# Patient Record
Sex: Female | Born: 1975 | Race: White | Hispanic: No | Marital: Married | Smoking: Former smoker
Health system: Southern US, Community
[De-identification: ages and names within clinical notes are randomized; demographics above are authoritative.]

## PROBLEM LIST (undated history)

## (undated) DIAGNOSIS — R112 Nausea with vomiting, unspecified: Secondary | ICD-10-CM

## (undated) DIAGNOSIS — Z9889 Other specified postprocedural states: Secondary | ICD-10-CM

## (undated) DIAGNOSIS — Z789 Other specified health status: Secondary | ICD-10-CM

---

## 2010-08-17 HISTORY — PX: CHOLECYSTECTOMY: SHX55

## 2011-02-14 ENCOUNTER — Inpatient Hospital Stay (HOSPITAL_COMMUNITY): Payer: Managed Care, Other (non HMO)

## 2011-02-14 ENCOUNTER — Inpatient Hospital Stay (HOSPITAL_COMMUNITY)
Admission: AD | Admit: 2011-02-14 | Discharge: 2011-02-14 | Disposition: A | Discharge status: Home / Self Care | Payer: Managed Care, Other (non HMO) | Source: Ambulatory Visit | Attending: Obstetrics and Gynecology | Admitting: Obstetrics and Gynecology

## 2011-02-14 DIAGNOSIS — R109 Unspecified abdominal pain: Secondary | ICD-10-CM

## 2011-02-14 DIAGNOSIS — N940 Mittelschmerz: Secondary | ICD-10-CM | POA: Insufficient documentation

## 2011-02-14 DIAGNOSIS — N949 Unspecified condition associated with female genital organs and menstrual cycle: Secondary | ICD-10-CM

## 2011-02-14 LAB — URINALYSIS, ROUTINE W REFLEX MICROSCOPIC
Bilirubin Urine: NEGATIVE
Glucose, UA: NEGATIVE mg/dL
Leukocytes, UA: NEGATIVE
Nitrite: NEGATIVE
Specific Gravity, Urine: 1.015 (ref 1.005–1.030)
pH: 5.5 (ref 5.0–8.0)

## 2011-02-14 LAB — POCT PREGNANCY, URINE: Preg Test, Ur: NEGATIVE

## 2013-08-17 HISTORY — PX: HERNIA REPAIR: SHX51

## 2014-03-25 ENCOUNTER — Observation Stay: Payer: Self-pay | Admitting: Surgery

## 2014-03-25 LAB — BASIC METABOLIC PANEL
ANION GAP: 8 (ref 7–16)
BUN: 7 mg/dL (ref 7–18)
CALCIUM: 8.7 mg/dL (ref 8.5–10.1)
CHLORIDE: 107 mmol/L (ref 98–107)
CREATININE: 0.87 mg/dL (ref 0.60–1.30)
Co2: 26 mmol/L (ref 21–32)
EGFR (African American): >60
GLUCOSE: 106 mg/dL — AB (ref 65–99)
OSMOLALITY: 280 (ref 275–301)
POTASSIUM: 3.7 mmol/L (ref 3.5–5.1)
Sodium: 141 mmol/L (ref 136–145)

## 2014-03-25 LAB — CBC
HCT: 41.8 % (ref 35.0–47.0)
HGB: 14.2 g/dL (ref 12.0–16.0)
MCH: 30.7 pg (ref 26.0–34.0)
MCHC: 33.9 g/dL (ref 32.0–36.0)
MCV: 91 fL (ref 80–100)
Platelet: 249 10*3/uL (ref 150–440)
RBC: 4.62 10*6/uL (ref 3.80–5.20)
RDW: 13 % (ref 11.5–14.5)
WBC: 10.8 10*3/uL (ref 3.6–11.0)

## 2014-03-25 LAB — HEPATIC FUNCTION PANEL A (ARMC)
Albumin: 3.3 g/dL — ABNORMAL LOW (ref 3.4–5.0)
Alkaline Phosphatase: 66 U/L
BILIRUBIN TOTAL: 0.4 mg/dL (ref 0.2–1.0)
Bilirubin, Direct: <0.1 mg/dL (ref 0.00–0.20)
SGOT(AST): 16 U/L (ref 15–37)
SGPT (ALT): 23 U/L
Total Protein: 7.4 g/dL (ref 6.4–8.2)

## 2014-03-25 LAB — PREGNANCY, URINE: Pregnancy Test, Urine: NEGATIVE m[IU]/mL

## 2014-03-25 LAB — PRO B NATRIURETIC PEPTIDE: B-Type Natriuretic Peptide: 153 pg/mL — ABNORMAL HIGH (ref 0–125)

## 2014-03-25 LAB — D-DIMER(ARMC): D-Dimer: 739 ng/ml

## 2014-03-25 LAB — TROPONIN I: Troponin-I: <0.02 ng/mL

## 2014-03-26 LAB — CBC WITH DIFFERENTIAL/PLATELET
BASOS PCT: 0.3 %
Basophil #: 0 10*3/uL (ref 0.0–0.1)
EOS PCT: 0.1 %
Eosinophil #: 0 10*3/uL (ref 0.0–0.7)
HCT: 35.2 % (ref 35.0–47.0)
HGB: 12 g/dL (ref 12.0–16.0)
LYMPHS PCT: 5.8 %
Lymphocyte #: 0.9 10*3/uL — ABNORMAL LOW (ref 1.0–3.6)
MCH: 30.6 pg (ref 26.0–34.0)
MCHC: 34 g/dL (ref 32.0–36.0)
MCV: 90 fL (ref 80–100)
MONO ABS: 0.8 x10 3/mm (ref 0.2–0.9)
Monocyte %: 5.4 %
NEUTROS PCT: 88.4 %
Neutrophil #: 13.6 10*3/uL — ABNORMAL HIGH (ref 1.4–6.5)
Platelet: 234 10*3/uL (ref 150–440)
RBC: 3.91 10*6/uL (ref 3.80–5.20)
RDW: 12.9 % (ref 11.5–14.5)
WBC: 15.4 10*3/uL — ABNORMAL HIGH (ref 3.6–11.0)

## 2014-03-26 LAB — COMPREHENSIVE METABOLIC PANEL
ALBUMIN: 2.6 g/dL — AB (ref 3.4–5.0)
ALT: 36 U/L
AST: 36 U/L (ref 15–37)
Alkaline Phosphatase: 50 U/L
Anion Gap: 8 (ref 7–16)
BILIRUBIN TOTAL: 0.4 mg/dL (ref 0.2–1.0)
BUN: 7 mg/dL (ref 7–18)
CALCIUM: 8 mg/dL — AB (ref 8.5–10.1)
CO2: 26 mmol/L (ref 21–32)
Chloride: 108 mmol/L — ABNORMAL HIGH (ref 98–107)
Creatinine: 0.85 mg/dL (ref 0.60–1.30)
EGFR (African American): >60
EGFR (Non-African Amer.): >60
Glucose: 137 mg/dL — ABNORMAL HIGH (ref 65–99)
OSMOLALITY: 283 (ref 275–301)
Potassium: 4.3 mmol/L (ref 3.5–5.1)
SODIUM: 142 mmol/L (ref 136–145)
Total Protein: 6.3 g/dL — ABNORMAL LOW (ref 6.4–8.2)

## 2014-03-28 LAB — PATHOLOGY REPORT

## 2014-12-08 NOTE — Op Note (Signed)
PATIENT NAME:  Janet Campbell, Janet Campbell MR#:  [id number] DATE OF BIRTH:  1976-05-17  DATE OF PROCEDURE:  03/25/2014  PREOPERATIVE DIAGNOSIS: Acute cholecystitis.   POSTOPERATIVE DIAGNOSIS: Acute gangrenous cholecystitis.  PROCEDURE: Laparoscopic cholecystectomy.   SURGEON: [place] Stell E. Excell Seltzerooper, M.D.   ANESTHESIA: General with endotracheal tube.   INDICATIONS: This is a patient with severe right upper quadrant pain, which has been unrelenting and work-up showing acute cholecystitis. Preoperatively, we discussed rationale for surgery, the options of observation, the risk of bleeding, infection, open procedure, bile duct damage, bile duct leak, retained common bile duct stone, any of which could require further surgery and/or ERCP, stent, and papillotomy. This was all reviewed for her. She understood and agreed to proceed.   FINDINGS: Acute cholecystitis, gangrenous, with extensive vascularization around the vessels, and huge gallstones necessitating enlargement of the upper incision.   DESCRIPTION OF PROCEDURE: The patient was induced to general anesthesia. She was on IV antibiotics. VTE prophylaxis was in place. She was prepped and draped in a sterile fashion. Marcaine was infiltrated in the skin and subcutaneous tissues around the periumbilical area. Incision was made. A Veress needle was placed. The pneumoperitoneum was obtained, and a 5 mm trocar port was placed. Of note, because of her morbid obesity, an extra long Veress needle and an extra long entry port in the 5 mm umbilical site was utilized.   The abdominal cavity was explored, and under direct vision, a 10 mm epigastric port and 2 lateral 5 mm ports were placed. The gallbladder was placed on tension, found to be greatly edematous and probably gangrenous. Dissection around the gallbladder demonstrated multiple engorged vessels. These vessels were divided with clips,   and this allowed for visualization of the cystic duct (Dictation Anomaly) <<as it  entered the>> the infundibulum of the gallbladder. The cystic duct gallbladder junction was well identified, doubly clipped and divided. The cystic artery branches were doubly clipped and divided. Multiple clips were utilized on the lateral side of the gallbladder fossa where extensive bleeding was encountered. The gallbladder was taken from the gallbladder fossa, both with blunt dissection and electrocautery dissection due to the severe edema, and the gallbladder was passed out through an enlarged epigastric port site with the aid of an Endo Catch bag.   The port site was replaced. The area was irrigated with copious amounts of normal saline. There was no sign of bowel injury or bile duct leak; however, there was still considerable bleeding from the gallbladder fossa, which was handled with electrocautery, and then once hemostasis was adequate, through the lateral port site a 10 mm JP drain was placed into the foramen of Winslow, held in with 3-0 nylon. Hemostasis again checked and found to be adequate. The camera was placed in the epigastric site to view back at the periumbilical site. There was no sign of bowel injury or adhesions. Therefore, pneumoperitoneum was released. All ports were removed. Fascial edges at the enlarged epigastric site were closed with multiple figure-of-eight 0 Vicryls and then 4-0 subcuticular Monocryl was used on all skin edges. Steri-Strips, Mastisol and sterile dressings were placed.  A drain was placed to bulb suction and the patient was taken to the recovery room in stable condition to be admitted for continued care.    ____________________________ Adah Salvageichard E. Excell Seltzerooper, MD rec:jr D: 03/25/2014 14:15:19 ET T: 03/25/2014 16:09:48 ET JOB#: [id number]  cc: Adah Salvageichard E. Excell Seltzerooper, MD, <Dictator> Lattie HawICHARD E Shaquira Moroz MD ELECTRONICALLY SIGNED 04/02/2014 19:21

## 2014-12-08 NOTE — H&P (Signed)
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 Assoc w/ nausea, no vomiting. No fever or jaundice Sx, or pain radiation. Only thing that helps is Ibuprofen.   Past History Obesity   ALLERGIES:  No Known Allergies:   Family and Social History:  Family History Non-Contributory   Social History positive  tobacco, negative ETOH, negative Illicit drugs, Single, lives with a roomate (who is OOT and has a dog at home); smokes 1 PPD, cutting down in last 2 weeks to 2 cigarettes per day currently   + Tobacco Prior (greater than 1 year)   Place of Living Home   Review of Systems:  Fever/Chills No   Cough No   Sputum No   Abdominal Pain No   Diarrhea No   Constipation No   Nausea/Vomiting Yes   SOB/DOE No   Chest Pain Yes   Dysuria No   Tolerating PT Yes   Tolerating Diet Yes  Nauseated   Medications/Allergies Reviewed Medications/Allergies reviewed   Physical Exam:  GEN well developed, well nourished, no acute distress, obese   HEENT pink conjunctivae, PERRL, hearing intact to voice, moist oral mucosa, Oropharynx clear, good dentition   NECK supple  trachea midline   RESP normal resp effort  clear BS  no use of accessory muscles   CARD regular rate  no murmur  no thrills  no JVD  no Rub   ABD denies tenderness   EXTR negative edema   SKIN normal to palpation, No rashes, skin turgor good   NEURO cranial nerves intact, negative tremor, follows commands, motor/sensory function intact   PSYCH alert, A+O to time, place, person, good insight   Lab Results: Hepatic:  08-Aug-15 23:33   Bilirubin, Total 0.4  Bilirubin, Direct < 0.1 (Result(s) reported on 25 Mar 2014 at 01:44AM.)  Alkaline Phosphatase 66 (46-116 NOTE: New Reference Range 03/06/14)  SGPT (ALT) 23 (14-63 NOTE: New Reference Range 03/06/14)  SGOT (AST) 16   Total Protein, Serum 7.4  Albumin, Serum  3.3  Routine Chem:  08-Aug-15 23:44   Glucose, Serum  106  BUN 7  Creatinine (comp) 0.87  Sodium, Serum 141  Potassium, Serum 3.7  Chloride, Serum 107  CO2, Serum 26  Calcium (Total), Serum 8.7  Anion Gap 8  Osmolality (calc) 280  eGFR (African American) >60  eGFR (Non-African American) >60 (eGFR values <68mL/min/1.73 m2 may be an indication of chronic kidney disease (CKD). Calculated eGFR is useful in patients with stable renal function. The eGFR calculation will not be reliable in acutely ill patients when serum creatinine is changing rapidly. It is not useful in  patients on dialysis. The eGFR calculation may not be applicable to patients at the low and high extremes of body sizes, pregnant women, and vegetarians.)  B-Type Natriuretic Peptide [facility])  153 (Result(s) reported on 25 Mar 2014 at 12:17AM.)  Cardiac:  08-Aug-15 23:44   Troponin I < 0.02 (0.00-0.05 0.05 ng/mL or less: NEGATIVE  Repeat testing in 3-6 hrs  if clinically indicated. >0.05 ng/mL: POTENTIAL  MYOCARDIAL INJURY. Repeat  testing in 3-6 hrs if  clinically indicated. NOTE: An increase or decrease  of 30% or more on serial  testing suggests a  clinically important change)  Routine Coag:  08-Aug-15 23:33   D-Dimer, Quantitative 739 (INTERPRETATION <> Exclusion of Venous Thromboembolism (VTE) - OUTPATIENT ONLY       (  Emergency Department or [place])             0-499 ng/ml (FEU)  : With a low to intermediate pretest                                  probability for VTE this test result                                  excludes the diagnosis of VTE.             > 499 ng/ml (FEU)  : VTE not excluded; additional work up                                  for VTE is required. <> Testing on Inpatients and Evaluation of Disseminated Intravascular        Coagulation (DIC)             Reference Range:  0-499 ng/ml (FEU))  Routine Hem:  08-Aug-15 23:44   WBC (CBC)  10.8  RBC (CBC) 4.62  Hemoglobin (CBC) 14.2  Hematocrit (CBC) 41.8  Platelet Count (CBC) 249 (Result(s) reported on 25 Mar 2014 at 12:05AM.)  MCV 91  MCH 30.7  MCHC 33.9  RDW 13.0    Assessment/Admission Diagnosis CTPA - 38 x 30 mm gallstone with surrounding edema by my review  Acute cholecystitis   Plan U/S Admit, IVF, IV ABx Lap CCY (pt understands Dr Burt Knack will see her later today and assume her care)   Electronic Signatures: Consuela Mimes (MD)  (Signed 09-Aug-15 05:42)  Authored: CHIEF COMPLAINT and HISTORY, ALLERGIES, FAMILY AND SOCIAL HISTORY, REVIEW OF SYSTEMS, PHYSICAL EXAM, LABS, ASSESSMENT AND PLAN   Last Updated: 09-Aug-15 05:42 by Consuela Mimes (MD)

## 2017-06-28 DIAGNOSIS — H5213 Myopia, bilateral: Secondary | ICD-10-CM | POA: Diagnosis not present

## 2021-04-24 ENCOUNTER — Other Ambulatory Visit: Payer: Self-pay | Admitting: General Surgery

## 2021-04-24 DIAGNOSIS — K432 Incisional hernia without obstruction or gangrene: Secondary | ICD-10-CM

## 2021-04-30 ENCOUNTER — Ambulatory Visit: Payer: Self-pay | Admitting: General Surgery

## 2021-04-30 ENCOUNTER — Ambulatory Visit
Admission: RE | Admit: 2021-04-30 | Discharge: 2021-04-30 | Disposition: A | Discharge status: Home / Self Care | Payer: Commercial Managed Care - PPO | Source: Ambulatory Visit | Attending: General Surgery | Admitting: General Surgery

## 2021-04-30 DIAGNOSIS — K432 Incisional hernia without obstruction or gangrene: Secondary | ICD-10-CM

## 2021-04-30 NOTE — H&P (Signed)
Subjective   Chief Complaint: No chief complaint on file.       History of Present Illness: Janet Campbell is a 45 y.o. female who is seen today for incisional hernia that is recurrent. Patient follows back up status post CT scan.  CT scan does show a 10.5 cm wide hernia and 7 cm long hernia.  Patient states that she is continue with significant discomfort.  Patient does have small bowel within the hernia.  I did review the CT scan personally and with the patient.   Patient does states she smokes approximate pack a day.     Patient does have history of laparoscopic ventral hernia repair with mesh that was in the epigastrium.  This was the area of gallbladder excision in the past.     Review of Systems: A complete review of systems was obtained from the patient.  I have reviewed this information and discussed as appropriate with the patient.  See HPI as well for other ROS.   Review of Systems  Constitutional: Negative for fever.  HENT: Negative for congestion.   Eyes: Negative for blurred vision.  Respiratory: Negative for cough, shortness of breath and wheezing.   Cardiovascular: Negative for chest pain and palpitations.  Gastrointestinal: Negative for heartburn.  Genitourinary: Negative for dysuria.  Musculoskeletal: Negative for myalgias.  Skin: Negative for rash.  Neurological: Negative for dizziness and headaches.  Psychiatric/Behavioral: Negative for depression and suicidal ideas.  All other systems reviewed and are negative.       Medical History: Past Medical History History reviewed. No pertinent past medical history.    There is no problem list on file for this patient.     Past Surgical History Past Surgical History: Procedure Laterality Date  CESAREAN SECTION      HERNIA REPAIR      LAPAROSCOPIC CHOLECYSTECTOMY          Allergies No Known Allergies    No current outpatient medications on file prior to visit.   No current facility-administered  medications on file prior to visit.     Family History Family History Problem Relation Age of Onset  Obesity Mother    High blood pressure (Hypertension) Father    Obesity Father    Obesity Brother        Social History   Tobacco Use Smoking Status Never Smoker Smokeless Tobacco Never Used     Social History Social History    Socioeconomic History  Marital status: Married Tobacco Use  Smoking status: Never Smoker  Smokeless tobacco: Never Used Substance and Sexual Activity  Alcohol use: Never  Drug use: Never      Objective:     There were no vitals filed for this visit.  There is no height or weight on file to calculate BMI.   Physical Exam Constitutional:      Appearance: Normal appearance.  HENT:     Head: Normocephalic and atraumatic.     Mouth/Throat:     Mouth: Mucous membranes are moist.     Pharynx: Oropharynx is clear.  Eyes:     General: No scleral icterus.    Pupils: Pupils are equal, round, and reactive to light.  Cardiovascular:     Rate and Rhythm: Normal rate and regular rhythm.     Pulses: Normal pulses.     Heart sounds: No murmur heard.   No friction rub. No gallop.  Pulmonary:     Effort: Pulmonary effort is normal. No respiratory distress.  Breath sounds: Normal breath sounds. No stridor.  Abdominal:     General: Abdomen is flat.     Hernia: A hernia is present.       Comments: Palp hernia  Musculoskeletal:        General: No swelling.  Skin:    General: Skin is warm.  Neurological:     General: No focal deficit present.     Mental Status: She is alert and oriented to person, place, and time. Mental status is at baseline.  Psychiatric:        Mood and Affect: Mood normal.        Thought Content: Thought content normal.        Judgment: Judgment normal.            Assessment and Plan: Diagnoses and all orders for this visit:   Recurrent incisional hernia with incarceration       45 year old female with a  recurrent incisional hernia 1.  We will proceed to the operating for exploratory laparotomy, lysis adhesions, repair of incisional hernia with mesh. 2.  Discussed with the risk and benefits of the procedure to include but not limited to: Infection, bleeding, damage to surrounding structures, possible need for further surgery.  Patient voiced understanding wished to proceed. 3.  I discussed with her to stop smoking.  Discussed with her that this will likely increase the chance of recurrence in the future.       No follow-ups on file.   Axel Filler, MD

## 2021-06-27 NOTE — Progress Notes (Signed)
Surgical Instructions    Your procedure is scheduled on 07/02/21.  Report to [facility] Main Entrance "A" at 6:30 A.M., then check in with the Admitting office.  Call this number if you have problems the morning of surgery:  [phone]   If you have any questions prior to your surgery date call [phone]: Open Monday-Friday 8am-4pm    Remember:  Do not eat after midnight the night before your surgery  You may drink clear liquids until 5:30 the morning of your surgery.   Clear liquids allowed are: Water, Non-Citrus Juices (without pulp), Carbonated Beverages, Clear Tea, Black Coffee ONLY (NO MILK, CREAM OR POWDERED CREAMER of any kind), and Gatorade    Take these medicines the morning of surgery with A SIP OF WATER: NONE   As of today, STOP taking any Aspirin (unless otherwise instructed by your surgeon) Aleve, Naproxen, Ibuprofen, Motrin, Advil, Goody's, BC's, all herbal medications, fish oil, and all vitamins.     After your COVID test   You are not required to quarantine however you are required to wear a well-fitting mask when you are out and around people not in your household.  If your mask becomes wet or soiled, replace with a new one.  Wash your hands often with soap and water for 20 seconds or clean your hands with an alcohol-based hand sanitizer that contains at least 60% alcohol.  Do not share personal items.  Notify your provider: if you are in close contact with someone who has COVID  or if you develop a fever of 100.4 or greater, sneezing, cough, sore throat, shortness of breath or body aches.             Do not wear jewelry or makeup Do not wear lotions, powders, perfumes or deodorant. Do not shave 48 hours prior to surgery.   Do not bring valuables to the hospital. DO Not wear nail polish, gel polish, artificial nails, or any other type of covering on natural nails including finger and toenails. If patients have artificial nails, gel coating, etc. that  need to be removed by a nail salon, please have this removed prior to surgery or surgery may need to be canceled/delayed if the surgeon/ anesthesia feels like the patient is unable to be adequately monitored.             [facility] is not responsible for any belongings or valuables.  Do NOT Smoke (Tobacco/Vaping)  24 hours prior to your procedure  If you use a CPAP at night, you may bring your mask for your overnight stay.   Contacts, glasses, hearing aids, dentures or partials may not be worn into surgery, please bring cases for these belongings   For patients admitted to the hospital, discharge time will be determined by your treatment team.   Patients discharged the day of surgery will not be allowed to drive home, and someone needs to stay with them for 24 hours.  NO VISITORS WILL BE ALLOWED IN PRE-OP WHERE PATIENTS ARE PREPPED FOR SURGERY.  ONLY 1 SUPPORT PERSON MAY BE PRESENT IN THE WAITING ROOM WHILE YOU ARE IN SURGERY.  IF YOU ARE TO BE ADMITTED, ONCE YOU ARE IN YOUR ROOM YOU WILL BE ALLOWED TWO (2) VISITORS. 1 (ONE) VISITOR MAY STAY OVERNIGHT BUT MUST ARRIVE TO THE ROOM BY 8pm.  Minor children may have two parents present. Special consideration for safety and communication needs will be reviewed on a case by case basis.  Special instructions:  Oral Hygiene is also important to reduce your risk of infection.  Remember - BRUSH YOUR TEETH THE MORNING OF SURGERY WITH YOUR REGULAR TOOTHPASTE   [place]- Preparing For Surgery  Before surgery, you can play an important role. Because skin is not sterile, your skin needs to be as free of germs as possible. You can reduce the number of germs on your skin by washing with CHG (chlorahexidine gluconate) Soap before surgery.  CHG is an antiseptic cleaner which kills germs and bonds with the skin to continue killing germs even after washing.     Please do not use if you have an allergy to CHG or antibacterial soaps. If your skin becomes  reddened/irritated stop using the CHG.  Do not shave (including legs and underarms) for at least 48 hours prior to first CHG shower. It is OK to shave your face.  Please follow these instructions carefully.     Shower the NIGHT BEFORE SURGERY and the MORNING OF SURGERY with CHG Soap.   If you chose to wash your hair, wash your hair first as usual with your normal shampoo. After you shampoo, rinse your hair and body thoroughly to remove the shampoo.  Then [employer] and genitals (private parts) with your normal soap and rinse thoroughly to remove soap.  After that Use CHG Soap as you would any other liquid soap. You can apply CHG directly to the skin and wash gently with a scrungie or a clean washcloth.   Apply the CHG Soap to your body ONLY FROM THE NECK DOWN.  Do not use on open wounds or open sores. Avoid contact with your eyes, ears, mouth and genitals (private parts). Wash Face and genitals (private parts)  with your normal soap.   Wash thoroughly, paying special attention to the area where your surgery will be performed.  Thoroughly rinse your body with warm water from the neck down.  DO NOT shower/wash with your normal soap after using and rinsing off the CHG Soap.  Pat yourself dry with a CLEAN TOWEL.  Wear CLEAN PAJAMAS to bed the night before surgery  Place CLEAN SHEETS on your bed the night before your surgery  DO NOT SLEEP WITH PETS.   Day of Surgery:  Take a shower with CHG soap. Wear Clean/Comfortable clothing the morning of surgery Do not apply any deodorants/lotions.   Remember to brush your teeth WITH YOUR REGULAR TOOTHPASTE.   Please read over the following fact sheets that you were given.

## 2021-06-30 ENCOUNTER — Encounter (HOSPITAL_COMMUNITY): Payer: Self-pay | Admitting: *Deleted

## 2021-06-30 ENCOUNTER — Other Ambulatory Visit: Payer: Self-pay

## 2021-06-30 ENCOUNTER — Encounter (HOSPITAL_COMMUNITY)
Admission: RE | Admit: 2021-06-30 | Discharge: 2021-06-30 | Disposition: A | Discharge status: Home / Self Care | Payer: No Typology Code available for payment source | Source: Ambulatory Visit | Attending: General Surgery | Admitting: General Surgery

## 2021-06-30 VITALS — BP 129/76 | HR 82 | Temp 97.9°F | Resp 18 | Ht 64.0 in | Wt 297.8 lb

## 2021-06-30 DIAGNOSIS — Z20822 Contact with and (suspected) exposure to covid-19: Secondary | ICD-10-CM | POA: Insufficient documentation

## 2021-06-30 DIAGNOSIS — Z01818 Encounter for other preprocedural examination: Secondary | ICD-10-CM

## 2021-06-30 DIAGNOSIS — Z01812 Encounter for preprocedural laboratory examination: Secondary | ICD-10-CM | POA: Insufficient documentation

## 2021-06-30 HISTORY — DX: Other specified health status: Z78.9

## 2021-06-30 HISTORY — DX: Nausea with vomiting, unspecified: R11.2

## 2021-06-30 HISTORY — DX: Other specified postprocedural states: Z98.890

## 2021-06-30 LAB — CBC
HCT: 40.5 % (ref 36.0–46.0)
Hemoglobin: 12.9 g/dL (ref 12.0–15.0)
MCH: 29.5 pg (ref 26.0–34.0)
MCHC: 31.9 g/dL (ref 30.0–36.0)
MCV: 92.5 fL (ref 80.0–100.0)
Platelets: 304 10*3/uL (ref 150–400)
RBC: 4.38 MIL/uL (ref 3.87–5.11)
RDW: 13.4 % (ref 11.5–15.5)
WBC: 8.5 10*3/uL (ref 4.0–10.5)
nRBC: 0 % (ref 0.0–0.2)

## 2021-06-30 LAB — SARS CORONAVIRUS 2 (TAT 6-24 HRS): SARS Coronavirus 2: NEGATIVE

## 2021-06-30 NOTE — Progress Notes (Signed)
PCP: denies Cardiologist: denies  EKG: 03/21/20 CXR: n/a ECHO: denies Stress Test: denies Cardiac Cath: denies  Surgeon's office called, Spoke to [employer], to make aware of pt's recent tooth abscess, tooth removal, and antibiotics use.  Pt completed course of Amoxicillin 500mg  BID, started on Jun 14, 2021 and finished Jun 21, 2021.  Tooth pulled on Jun 25, 2021.  Covid Tested at appt, aware to wear a mask in public.  ERAS, clears until 5:30am  Patient denies shortness of breath, fever, cough, and chest pain at PAT appointment.  Patient verbalized understanding of instructions provided today at the PAT appointment.  Patient asked to review instructions at home and day of surgery.

## 2021-07-01 NOTE — Anesthesia Preprocedure Evaluation (Addendum)
Anesthesia Evaluation  Patient identified by MRN, date of birth, ID band Patient awake    Reviewed: Allergy & Precautions, NPO status , Patient's Chart, lab work & pertinent test results  History of Anesthesia Complications (+) PONV and history of anesthetic complications  Airway Mallampati: III  TM Distance: >3 FB Neck ROM: Full    Dental  (+) Poor Dentition, Chipped, Dental Advisory Given Very poor dentition throughout, states nothing is loose:   Pulmonary former smoker,  38 pack year hx   Pulmonary exam normal breath sounds clear to auscultation       Cardiovascular negative cardio ROS Normal cardiovascular exam Rhythm:Regular Rate:Normal     Neuro/Psych negative neurological ROS  negative psych ROS   GI/Hepatic negative GI ROS, Neg liver ROS,   Endo/Other  Morbid obesityBMI 51  Renal/GU negative Renal ROS  negative genitourinary   Musculoskeletal negative musculoskeletal ROS (+)   Abdominal (+) + obese,   Peds  Hematology negative hematology ROS (+) hct 40.5   Anesthesia Other Findings Recurrent incisional hernia   Reproductive/Obstetrics negative OB ROS                            Anesthesia Physical Anesthesia Plan  ASA: 3  Anesthesia Plan: General   Post-op Pain Management:    Induction: Intravenous  PONV Risk Score and Plan: 4 or greater and Ondansetron, Dexamethasone, Midazolam, Scopolamine patch - Pre-op, Treatment may vary due to age or medical condition and Aprepitant  Airway Management Planned: Oral ETT  Additional Equipment:   Intra-op Plan:   Post-operative Plan: Extubation in OR  Informed Consent: I have reviewed the patients History and Physical, chart, labs and discussed the procedure including the risks, benefits and alternatives for the proposed anesthesia with the patient or authorized representative who has indicated his/her understanding and acceptance.      Dental advisory given  Plan Discussed with: CRNA  Anesthesia Plan Comments:        Anesthesia Quick Evaluation

## 2021-07-02 ENCOUNTER — Ambulatory Visit (HOSPITAL_COMMUNITY): Payer: No Typology Code available for payment source | Admitting: Certified Registered Nurse Anesthetist

## 2021-07-02 ENCOUNTER — Other Ambulatory Visit: Payer: Self-pay

## 2021-07-02 ENCOUNTER — Encounter (HOSPITAL_COMMUNITY)
Admission: RE | Disposition: A | Discharge status: Home / Self Care | Payer: Self-pay | Source: Home / Self Care | Attending: General Surgery

## 2021-07-02 ENCOUNTER — Encounter (HOSPITAL_COMMUNITY): Payer: Self-pay | Admitting: General Surgery

## 2021-07-02 ENCOUNTER — Ambulatory Visit (HOSPITAL_COMMUNITY)
Admission: RE | Admit: 2021-07-02 | Discharge: 2021-07-02 | Disposition: A | Discharge status: Home / Self Care | Payer: No Typology Code available for payment source | Attending: General Surgery | Admitting: General Surgery

## 2021-07-02 DIAGNOSIS — Z87891 Personal history of nicotine dependence: Secondary | ICD-10-CM | POA: Insufficient documentation

## 2021-07-02 DIAGNOSIS — Z6841 Body Mass Index (BMI) 40.0 and over, adult: Secondary | ICD-10-CM | POA: Insufficient documentation

## 2021-07-02 DIAGNOSIS — K430 Incisional hernia with obstruction, without gangrene: Secondary | ICD-10-CM | POA: Diagnosis present

## 2021-07-02 DIAGNOSIS — K432 Incisional hernia without obstruction or gangrene: Secondary | ICD-10-CM

## 2021-07-02 HISTORY — PX: LYSIS OF ADHESION: SHX5961

## 2021-07-02 HISTORY — PX: LAPAROTOMY: SHX154

## 2021-07-02 HISTORY — PX: INSERTION OF MESH: SHX5868

## 2021-07-02 HISTORY — PX: INCISIONAL HERNIA REPAIR: SHX193

## 2021-07-02 LAB — POCT PREGNANCY, URINE: Preg Test, Ur: NEGATIVE

## 2021-07-02 SURGERY — LAPAROTOMY, EXPLORATORY
Anesthesia: General | Site: Abdomen

## 2021-07-02 MED ORDER — PHENYLEPHRINE 40 MCG/ML (10ML) SYRINGE FOR IV PUSH (FOR BLOOD PRESSURE SUPPORT)
PREFILLED_SYRINGE | INTRAVENOUS | Status: AC
  Filled 2021-07-02: qty 10

## 2021-07-02 MED ORDER — LIDOCAINE 2% (20 MG/ML) 5 ML SYRINGE
INTRAMUSCULAR | Status: DC | PRN
  Administered 2021-07-02: 60 mg via INTRAVENOUS

## 2021-07-02 MED ORDER — PROPOFOL 10 MG/ML IV BOLUS
INTRAVENOUS | Status: AC
  Filled 2021-07-02: qty 40

## 2021-07-02 MED ORDER — ACETAMINOPHEN 500 MG PO TABS
1000.0000 mg | ORAL_TABLET | Freq: Once | ORAL | Status: AC
  Administered 2021-07-02: 1000 mg via ORAL
  Filled 2021-07-02: qty 2

## 2021-07-02 MED ORDER — MEPERIDINE HCL 25 MG/ML IJ SOLN: 6.2500 mg | INTRAMUSCULAR | Status: DC | PRN

## 2021-07-02 MED ORDER — CHLORHEXIDINE GLUCONATE CLOTH 2 % EX PADS: 6.0000 | MEDICATED_PAD | Freq: Once | CUTANEOUS | Status: DC

## 2021-07-02 MED ORDER — MIDAZOLAM HCL 5 MG/5ML IJ SOLN
INTRAMUSCULAR | Status: DC | PRN
  Administered 2021-07-02: 2 mg via INTRAVENOUS

## 2021-07-02 MED ORDER — DEXAMETHASONE SODIUM PHOSPHATE 10 MG/ML IJ SOLN
INTRAMUSCULAR | Status: DC | PRN
  Administered 2021-07-02: 10 mg via INTRAVENOUS

## 2021-07-02 MED ORDER — HYDROMORPHONE HCL 1 MG/ML IJ SOLN
0.2500 mg | INTRAMUSCULAR | Status: DC | PRN
  Administered 2021-07-02: 0.25 mg via INTRAVENOUS

## 2021-07-02 MED ORDER — KETAMINE HCL 10 MG/ML IJ SOLN
INTRAMUSCULAR | Status: DC | PRN
  Administered 2021-07-02: 40 mg via INTRAVENOUS
  Administered 2021-07-02: 10 mg via INTRAVENOUS

## 2021-07-02 MED ORDER — SUGAMMADEX SODIUM 200 MG/2ML IV SOLN
INTRAVENOUS | Status: DC | PRN
  Administered 2021-07-02: 300 mg via INTRAVENOUS

## 2021-07-02 MED ORDER — ACETAMINOPHEN 500 MG PO TABS: 1000.0000 mg | ORAL_TABLET | ORAL | Status: DC

## 2021-07-02 MED ORDER — BUPIVACAINE LIPOSOME 1.3 % IJ SUSP
INTRAMUSCULAR | Status: AC
  Filled 2021-07-02: qty 20

## 2021-07-02 MED ORDER — PROPOFOL 10 MG/ML IV BOLUS
INTRAVENOUS | Status: DC | PRN
  Administered 2021-07-02: 200 mg via INTRAVENOUS

## 2021-07-02 MED ORDER — SCOPOLAMINE 1 MG/3DAYS TD PT72
1.0000 | MEDICATED_PATCH | TRANSDERMAL | Status: DC
  Administered 2021-07-02: 1.5 mg via TRANSDERMAL
  Filled 2021-07-02: qty 1

## 2021-07-02 MED ORDER — PHENYLEPHRINE HCL-NACL 20-0.9 MG/250ML-% IV SOLN
INTRAVENOUS | Status: DC | PRN
  Administered 2021-07-02: 25 ug/min via INTRAVENOUS

## 2021-07-02 MED ORDER — FENTANYL CITRATE (PF) 250 MCG/5ML IJ SOLN
INTRAMUSCULAR | Status: AC
  Filled 2021-07-02: qty 5

## 2021-07-02 MED ORDER — MIDAZOLAM HCL 2 MG/2ML IJ SOLN
INTRAMUSCULAR | Status: AC
  Filled 2021-07-02: qty 2

## 2021-07-02 MED ORDER — KETAMINE HCL 50 MG/5ML IJ SOSY
PREFILLED_SYRINGE | INTRAMUSCULAR | Status: AC
  Filled 2021-07-02: qty 5

## 2021-07-02 MED ORDER — SODIUM CHLORIDE 0.9% FLUSH
INTRAVENOUS | Status: DC | PRN
  Administered 2021-07-02: 20 mL

## 2021-07-02 MED ORDER — ONDANSETRON HCL 4 MG/2ML IJ SOLN
INTRAMUSCULAR | Status: AC
  Filled 2021-07-02: qty 2

## 2021-07-02 MED ORDER — HYDROMORPHONE HCL 1 MG/ML IJ SOLN
INTRAMUSCULAR | Status: AC
  Filled 2021-07-02: qty 1

## 2021-07-02 MED ORDER — FENTANYL CITRATE (PF) 250 MCG/5ML IJ SOLN
INTRAMUSCULAR | Status: DC | PRN
  Administered 2021-07-02 (×5): 50 ug via INTRAVENOUS

## 2021-07-02 MED ORDER — ROCURONIUM BROMIDE 10 MG/ML (PF) SYRINGE
PREFILLED_SYRINGE | INTRAVENOUS | Status: DC | PRN
  Administered 2021-07-02: 10 mg via INTRAVENOUS
  Administered 2021-07-02 (×2): 20 mg via INTRAVENOUS
  Administered 2021-07-02: 50 mg via INTRAVENOUS
  Administered 2021-07-02: 20 mg via INTRAVENOUS

## 2021-07-02 MED ORDER — PROMETHAZINE HCL 25 MG/ML IJ SOLN: 6.2500 mg | INTRAMUSCULAR | Status: DC | PRN

## 2021-07-02 MED ORDER — KETOROLAC TROMETHAMINE 30 MG/ML IJ SOLN: 30.0000 mg | Freq: Once | INTRAMUSCULAR | Status: DC | PRN

## 2021-07-02 MED ORDER — HYDROMORPHONE HCL 1 MG/ML IJ SOLN
INTRAMUSCULAR | Status: AC
  Filled 2021-07-02: qty 0.5

## 2021-07-02 MED ORDER — HYDROMORPHONE HCL 1 MG/ML IJ SOLN
INTRAMUSCULAR | Status: DC | PRN
  Administered 2021-07-02: .5 mg via INTRAVENOUS

## 2021-07-02 MED ORDER — APREPITANT 40 MG PO CAPS
40.0000 mg | ORAL_CAPSULE | Freq: Once | ORAL | Status: AC
  Administered 2021-07-02: 40 mg via ORAL
  Filled 2021-07-02: qty 1

## 2021-07-02 MED ORDER — ONDANSETRON HCL 4 MG/2ML IJ SOLN
INTRAMUSCULAR | Status: DC | PRN
  Administered 2021-07-02: 4 mg via INTRAVENOUS

## 2021-07-02 MED ORDER — LACTATED RINGERS IV SOLN: INTRAVENOUS | Status: DC

## 2021-07-02 MED ORDER — ORAL CARE MOUTH RINSE: 15.0000 mL | Freq: Once | OROMUCOSAL | Status: AC

## 2021-07-02 MED ORDER — TRAMADOL HCL 50 MG PO TABS: 50.0000 mg | ORAL_TABLET | Freq: Four times a day (QID) | ORAL | 0 refills | Status: AC | PRN

## 2021-07-02 MED ORDER — VISTASEAL 10 ML SINGLE DOSE KIT
PACK | CUTANEOUS | Status: DC | PRN
  Administered 2021-07-02: 10 mL via TOPICAL

## 2021-07-02 MED ORDER — AMISULPRIDE (ANTIEMETIC) 5 MG/2ML IV SOLN
10.0000 mg | Freq: Once | INTRAVENOUS | Status: AC | PRN
  Administered 2021-07-02: 10 mg via INTRAVENOUS

## 2021-07-02 MED ORDER — CHLORHEXIDINE GLUCONATE 0.12 % MT SOLN
15.0000 mL | Freq: Once | OROMUCOSAL | Status: AC
  Administered 2021-07-02: 15 mL via OROMUCOSAL
  Filled 2021-07-02: qty 15

## 2021-07-02 MED ORDER — BUPIVACAINE LIPOSOME 1.3 % IJ SUSP
INTRAMUSCULAR | Status: DC | PRN
  Administered 2021-07-02: 20 mL

## 2021-07-02 MED ORDER — ROCURONIUM BROMIDE 10 MG/ML (PF) SYRINGE
PREFILLED_SYRINGE | INTRAVENOUS | Status: AC
  Filled 2021-07-02: qty 10

## 2021-07-02 MED ORDER — 0.9 % SODIUM CHLORIDE (POUR BTL) OPTIME
TOPICAL | Status: DC | PRN
  Administered 2021-07-02: 2000 mL

## 2021-07-02 MED ORDER — OXYCODONE HCL 5 MG/5ML PO SOLN: 5.0000 mg | Freq: Once | ORAL | Status: DC | PRN

## 2021-07-02 MED ORDER — PHENYLEPHRINE 40 MCG/ML (10ML) SYRINGE FOR IV PUSH (FOR BLOOD PRESSURE SUPPORT)
PREFILLED_SYRINGE | INTRAVENOUS | Status: DC | PRN
  Administered 2021-07-02 (×4): 80 ug via INTRAVENOUS
  Administered 2021-07-02: 40 ug via INTRAVENOUS

## 2021-07-02 MED ORDER — AMISULPRIDE (ANTIEMETIC) 5 MG/2ML IV SOLN
INTRAVENOUS | Status: AC
  Filled 2021-07-02: qty 4

## 2021-07-02 MED ORDER — VISTASEAL 10 ML SINGLE DOSE KIT
10.0000 mL | PACK | Freq: Once | CUTANEOUS | Status: DC
  Filled 2021-07-02 (×2): qty 10

## 2021-07-02 MED ORDER — SUCCINYLCHOLINE CHLORIDE 200 MG/10ML IV SOSY
PREFILLED_SYRINGE | INTRAVENOUS | Status: DC | PRN
  Administered 2021-07-02: 180 mg via INTRAVENOUS

## 2021-07-02 MED ORDER — DEXAMETHASONE SODIUM PHOSPHATE 10 MG/ML IJ SOLN
INTRAMUSCULAR | Status: AC
  Filled 2021-07-02: qty 1

## 2021-07-02 MED ORDER — CEFAZOLIN IN SODIUM CHLORIDE 3-0.9 GM/100ML-% IV SOLN
3.0000 g | INTRAVENOUS | Status: AC
  Administered 2021-07-02: 3 g via INTRAVENOUS
  Filled 2021-07-02: qty 100

## 2021-07-02 MED ORDER — KETOROLAC TROMETHAMINE 30 MG/ML IJ SOLN
INTRAMUSCULAR | Status: DC | PRN
  Administered 2021-07-02: 30 mg via INTRAVENOUS

## 2021-07-02 MED ORDER — KETOROLAC TROMETHAMINE 30 MG/ML IJ SOLN
INTRAMUSCULAR | Status: AC
  Filled 2021-07-02: qty 1

## 2021-07-02 MED ORDER — OXYCODONE HCL 5 MG PO TABS: 5.0000 mg | ORAL_TABLET | Freq: Once | ORAL | Status: DC | PRN

## 2021-07-02 SURGICAL SUPPLY — 63 items
BAG COUNTER SPONGE SURGICOUNT (BAG) ×4 IMPLANT
BAG SURGICOUNT SPONGE COUNTING (BAG) ×2
BINDER ABDOMINAL 12 ML 46-62 (SOFTGOODS) ×2 IMPLANT
BLADE CLIPPER SURG (BLADE) ×2 IMPLANT
BLADE SURG 11 STRL SS (BLADE) ×4 IMPLANT
CANISTER SUCT 3000ML PPV (MISCELLANEOUS) ×4 IMPLANT
CHLORAPREP W/TINT 26 (MISCELLANEOUS) ×4 IMPLANT
COVER SURGICAL LIGHT HANDLE (MISCELLANEOUS) ×4 IMPLANT
DERMABOND ADVANCED (GAUZE/BANDAGES/DRESSINGS) ×2
DERMABOND ADVANCED .7 DNX12 (GAUZE/BANDAGES/DRESSINGS) IMPLANT
DEVICE TROCAR PUNCTURE CLOSURE (ENDOMECHANICALS) IMPLANT
DRAIN CHANNEL 19F RND (DRAIN) IMPLANT
DRAPE INCISE IOBAN 66X45 STRL (DRAPES) IMPLANT
DRAPE LAPAROSCOPIC ABDOMINAL (DRAPES) ×4 IMPLANT
DRAPE WARM FLUID 44X44 (DRAPES) ×4 IMPLANT
DRSG OPSITE POSTOP 4X10 (GAUZE/BANDAGES/DRESSINGS) ×2 IMPLANT
DRSG OPSITE POSTOP 4X8 (GAUZE/BANDAGES/DRESSINGS) ×2 IMPLANT
ELECT BLADE 6.5 EXT (BLADE) IMPLANT
ELECT CAUTERY BLADE 6.4 (BLADE) ×4 IMPLANT
ELECT REM PT RETURN 9FT ADLT (ELECTROSURGICAL) ×4
ELECTRODE REM PT RTRN 9FT ADLT (ELECTROSURGICAL) ×2 IMPLANT
EVACUATOR SILICONE 100CC (DRAIN) IMPLANT
GLOVE SRG 8 PF TXTR STRL LF DI (GLOVE) ×2 IMPLANT
GLOVE SURG ENC MOIS LTX SZ7.5 (GLOVE) ×4 IMPLANT
GLOVE SURG UNDER POLY LF SZ8 (GLOVE) ×2
GOWN STRL REUS W/ TWL LRG LVL3 (GOWN DISPOSABLE) ×2 IMPLANT
GOWN STRL REUS W/ TWL XL LVL3 (GOWN DISPOSABLE) ×2 IMPLANT
GOWN STRL REUS W/TWL LRG LVL3 (GOWN DISPOSABLE) ×2
GOWN STRL REUS W/TWL XL LVL3 (GOWN DISPOSABLE) ×2
HANDLE SUCTION POOLE (INSTRUMENTS) ×2 IMPLANT
KIT BASIN OR (CUSTOM PROCEDURE TRAY) ×4 IMPLANT
KIT TURNOVER KIT B (KITS) ×4 IMPLANT
LIGASURE IMPACT 36 18CM CVD LR (INSTRUMENTS) IMPLANT
MARKER SKIN DUAL TIP RULER LAB (MISCELLANEOUS) ×2 IMPLANT
MESH PROLENE PML 12X12 (Mesh General) ×2 IMPLANT
NEEDLE HYPO 22GX1.5 SAFETY (NEEDLE) IMPLANT
NS IRRIG 1000ML POUR BTL (IV SOLUTION) ×8 IMPLANT
PACK GENERAL/GYN (CUSTOM PROCEDURE TRAY) ×4 IMPLANT
PAD ARMBOARD 7.5X6 YLW CONV (MISCELLANEOUS) ×8 IMPLANT
PENCIL SMOKE EVACUATOR (MISCELLANEOUS) ×4 IMPLANT
RETAINER VISCERA MED (MISCELLANEOUS) ×2 IMPLANT
SPECIMEN JAR LARGE (MISCELLANEOUS) IMPLANT
SPONGE T-LAP 18X18 ~~LOC~~+RFID (SPONGE) ×8 IMPLANT
STAPLER VISISTAT 35W (STAPLE) ×4 IMPLANT
SUCTION POOLE HANDLE (INSTRUMENTS) ×4
SUT ETHILON 3 0 FSL (SUTURE) IMPLANT
SUT NOVA NAB GS-21 0 18 T12 DT (SUTURE) ×4 IMPLANT
SUT PDS AB 0 CT 36 (SUTURE) IMPLANT
SUT PDS AB 1 TP1 54 (SUTURE) ×16 IMPLANT
SUT SILK 2 0 SH CR/8 (SUTURE) ×4 IMPLANT
SUT SILK 2 0 TIES 10X30 (SUTURE) ×4 IMPLANT
SUT SILK 3 0 SH CR/8 (SUTURE) ×4 IMPLANT
SUT SILK 3 0 TIES 10X30 (SUTURE) ×4 IMPLANT
SUT VIC AB 3-0 SH 8-18 (SUTURE) ×2 IMPLANT
SUT VICRYL AB 2 0 TIES (SUTURE) ×4 IMPLANT
SYR CONTROL 10ML LL (SYRINGE) IMPLANT
TOWEL GREEN STERILE (TOWEL DISPOSABLE) ×4 IMPLANT
TOWEL GREEN STERILE FF (TOWEL DISPOSABLE) ×4 IMPLANT
TRAY FOLEY MTR SLVR 16FR STAT (SET/KITS/TRAYS/PACK) ×4 IMPLANT
TRAY FOLEY W/BAG SLVR 16FR (SET/KITS/TRAYS/PACK)
TRAY FOLEY W/BAG SLVR 16FR ST (SET/KITS/TRAYS/PACK) IMPLANT
WATER STERILE IRR 1000ML POUR (IV SOLUTION) ×4 IMPLANT
YANKAUER SUCT BULB TIP NO VENT (SUCTIONS) IMPLANT

## 2021-07-02 NOTE — Anesthesia Postprocedure Evaluation (Signed)
Anesthesia Post Note  Patient: NOELLA KIPNIS  Procedure(s) Performed: EXPLORATORY LAPAROTOMY HERNIA REPAIR INCISIONAL REPAIR WITH MESH TAR LYSIS OF ADHESION     Patient location during evaluation: PACU Anesthesia Type: General Level of consciousness: awake and alert, oriented and patient cooperative Pain management: pain level controlled Vital Signs Assessment: post-procedure vital signs reviewed and stable Respiratory status: spontaneous breathing, nonlabored ventilation and respiratory function stable Cardiovascular status: blood pressure returned to baseline and stable Postop Assessment: no apparent nausea or vomiting Anesthetic complications: no   No notable events documented.  Last Vitals:  Vitals:   07/02/21 1230 07/02/21 1241  BP: 109/67 105/69  Pulse: 80 76  Resp: 15 13  Temp:    SpO2: 93% 92%    Last Pain:  Vitals:   07/02/21 1230  TempSrc:   PainSc: Asleep                 Lannie Fields

## 2021-07-02 NOTE — Discharge Instructions (Signed)
[facility] _____[facility]  HERNIA REPAIR: POST OP INSTRUCTIONS  Always review your discharge instruction sheet given to you by the facility where your surgery was performed. IF YOU HAVE DISABILITY OR FAMILY LEAVE FORMS, YOU MUST BRING THEM TO THE OFFICE FOR PROCESSING.   DO NOT GIVE THEM TO YOUR DOCTOR.  1. A  prescription for pain medication may be given to you upon discharge.  Take your pain medication as prescribed, if needed.  If narcotic pain medicine is not needed, then you may take acetaminophen (Tylenol) or ibuprofen (Advil) as needed. 2. Take your usually prescribed medications unless otherwise directed. If you need a refill on your pain medication, please contact your pharmacy.  They will contact our office to request authorization. Prescriptions will not be filled after 5 pm or on week-ends. 3. You should follow a light diet the first 24 hours after arrival home, such as soup and crackers, etc.  Be sure to include lots of fluids daily.  Resume your normal diet the day after surgery. 4.Most patients will experience some swelling and bruising around the umbilicus or in the groin and scrotum.  Ice packs and reclining will help.  Swelling and bruising can take several days to resolve.  6. It is common to experience some constipation if taking pain medication after surgery.  Increasing fluid intake and taking a stool softener (such as Colace) will usually help or prevent this problem from occurring.  A mild laxative (Milk of Magnesia or Miralax) should be taken according to package directions if there are no bowel movements after 48 hours. 7. Unless discharge instructions indicate otherwise, you may remove your bandages 24-48 hours after surgery, and you may shower at that time.  You may have steri-strips (small skin tapes) in place directly over the incision.  These strips should be left on the skin for 7-10 days.  If your surgeon used skin glue on the incision, you may shower in  24 hours.  The glue will flake off over the next 2-3 weeks.  Any sutures or staples will be removed at the office during your follow-up visit. 8. ACTIVITIES:  You may resume regular (light) daily activities beginning the next day--such as daily self-care, walking, climbing stairs--gradually increasing activities as tolerated.  You may have sexual intercourse when it is comfortable.  Refrain from any heavy lifting or straining until approved by your doctor.  a.You may drive when you are no longer taking prescription pain medication, you can comfortably wear a seatbelt, and you can safely maneuver your car and apply brakes. b.RETURN TO WORK:   _____________________________________________  9.You should see your doctor in the office for a follow-up appointment approximately 2-3 weeks after your surgery.  Make sure that you call for this appointment within a day or two after you arrive home to insure a convenient appointment time. 10.OTHER INSTRUCTIONS: _________________________    _____________________________________  WHEN TO CALL YOUR DOCTOR: Fever over 101.0 Inability to urinate Nausea and/or vomiting Extreme swelling or bruising Continued bleeding from incision. Increased pain, redness, or drainage from the incision  The clinic staff is available to answer your questions during regular business hours.  Please don't hesitate to call and ask to speak to one of the nurses for clinical concerns.  If you have a medical emergency, go to the nearest emergency room or call 911.  A surgeon from [facility] is always on call at the hospital   [address], [address], [place], [place]    [address] ?  [address] [address], [place], [place]   [address] [phone] ? [phone] ? FAX [phone] Web site: [email or web address]  

## 2021-07-02 NOTE — Transfer of Care (Signed)
Immediate Anesthesia Transfer of Care Note  Patient: Janet Campbell  Procedure(s) Performed: EXPLORATORY LAPAROTOMY HERNIA REPAIR INCISIONAL REPAIR WITH MESH TAR LYSIS OF ADHESION  Patient Location: PACU  Anesthesia Type:General  Level of Consciousness: drowsy and patient cooperative  Airway & Oxygen Therapy: Patient Spontanous Breathing and Patient connected to face mask oxygen  Post-op Assessment: Report given to RN, Post -op Vital signs reviewed and stable and Patient moving all extremities X 4  Post vital signs: Reviewed and stable  Last Vitals:  Vitals Value Taken Time  BP    Temp    Pulse 64   Resp 12   SpO2 96     Last Pain:  Vitals:   07/02/21 0730  TempSrc:   PainSc: 0-No pain         Complications: No notable events documented.

## 2021-07-02 NOTE — H&P (Signed)
Chief Complaint: No chief complaint on file.       History of Present Illness: Janet Campbell is a 45 y.o. female who is seen today for incisional hernia that is recurrent. Patient follows back up status post CT scan.  CT scan does show a 10.5 cm wide hernia and 7 cm long hernia.  Patient states that she is continue with significant discomfort.  Patient does have small bowel within the hernia.  I did review the CT scan personally and with the patient.   Patient does states she smokes approximate pack a day.     Patient does have history of laparoscopic ventral hernia repair with mesh that was in the epigastrium.  This was the area of gallbladder excision in the past.     Review of Systems: A complete review of systems was obtained from the patient.  I have reviewed this information and discussed as appropriate with the patient.  See HPI as well for other ROS.   Review of Systems  Constitutional: Negative for fever.  HENT: Negative for congestion.   Eyes: Negative for blurred vision.  Respiratory: Negative for cough, shortness of breath and wheezing.   Cardiovascular: Negative for chest pain and palpitations.  Gastrointestinal: Negative for heartburn.  Genitourinary: Negative for dysuria.  Musculoskeletal: Negative for myalgias.  Skin: Negative for rash.  Neurological: Negative for dizziness and headaches.  Psychiatric/Behavioral: Negative for depression and suicidal ideas.  All other systems reviewed and are negative.       Medical History: Past Medical History History reviewed. No pertinent past medical history.     There is no problem list on file for this patient.     Past Surgical History Past Surgical History: Procedure       Laterality         Date            CESAREAN SECTION                                     HERNIA REPAIR                                  LAPAROSCOPIC CHOLECYSTECTOMY                         Allergies No Known Allergies     No current  outpatient medications on file prior to visit.   No current facility-administered medications on file prior to visit.     Family History Family History Problem           Relation           Age of Onset            Obesity            Mother              High blood pressure (Hypertension)   Father               Obesity            Father               Obesity            Brother  Social History   Tobacco Use Smoking Status           Never Smoker Smokeless Tobacco   Never Used     Social History Social History     Socioeconomic History            Marital status:  Married Tobacco Use            Smoking status:          Never Smoker            Smokeless tobacco:    Never Used Substance and Sexual Activity            Alcohol use:    Never            Drug use:        Never       Objective:     There were no vitals filed for this visit.  There is no height or weight on file to calculate BMI.   Physical Exam Constitutional:      Appearance: Normal appearance.  HENT:     Head: Normocephalic and atraumatic.     Mouth/Throat:     Mouth: Mucous membranes are moist.     Pharynx: Oropharynx is clear.  Eyes:     General: No scleral icterus.    Pupils: Pupils are equal, round, and reactive to light.  Cardiovascular:     Rate and Rhythm: Normal rate and regular rhythm.     Pulses: Normal pulses.     Heart sounds: No murmur heard.   No friction rub. No gallop.  Pulmonary:     Effort: Pulmonary effort is normal. No respiratory distress.     Breath sounds: Normal breath sounds. No stridor.  Abdominal:     General: Abdomen is flat.     Hernia: A hernia is present.         Comments: Palp hernia  Musculoskeletal:        General: No swelling.  Skin:    General: Skin is warm.  Neurological:     General: No focal deficit present.     Mental Status: She is alert and oriented to person, place, and time. Mental status is at baseline.  Psychiatric:         Mood and Affect: Mood normal.        Thought Content: Thought content normal.        Judgment: Judgment normal.            Assessment and Plan: Diagnoses and all orders for this visit:   Recurrent incisional hernia with incarceration       45 year old female with a recurrent incisional hernia 1.  We will proceed to the operating for exploratory laparotomy, lysis adhesions, repair of incisional hernia with mesh. 2.  Discussed with the risk and benefits of the procedure to include but not limited to: Infection, bleeding, damage to surrounding structures, possible need for further surgery.  Patient voiced understanding wished to proceed. 3.  I discussed with her to stop smoking.  Discussed with her that this will likely increase the chance of recurrence in the future.       No follow-ups on file.   Axel Filler, MD

## 2021-07-02 NOTE — Anesthesia Procedure Notes (Signed)
Procedure Name: Intubation Date/Time: 07/02/2021 8:41 AM Performed by: Waynard Edwards, CRNA Pre-anesthesia Checklist: Patient identified, Emergency Drugs available, Suction available and Patient being monitored Patient Re-evaluated:Patient Re-evaluated prior to induction Oxygen Delivery Method: Circle system utilized Preoxygenation: Pre-oxygenation with 100% oxygen Induction Type: IV induction Ventilation: Mask ventilation without difficulty Laryngoscope Size: Miller and 2 Grade View: Grade I Tube type: Oral Tube size: 7.0 mm Number of attempts: 1 Airway Equipment and Method: Stylet Placement Confirmation: ETT inserted through vocal cords under direct vision, positive ETCO2 and breath sounds checked- equal and bilateral Secured at: 22 cm Tube secured with: Tape Dental Injury: Teeth and Oropharynx as per pre-operative assessment

## 2021-07-02 NOTE — Op Note (Signed)
07/02/2021  11:02 AM  PATIENT:  Janet Campbell  45 y.o. female  PRE-OPERATIVE DIAGNOSIS:  recurrent incisional hernia  POST-OPERATIVE DIAGNOSIS:  recurrent incisional hernia  PROCEDURE:  Procedure(s): EXPLORATORY LAPAROTOMY (N/A) HERNIA REPAIR INCISIONAL REPAIR WITH MESH MUSCULOFASCIAL RELEASE (N/A) LYSIS OF ADHESION (N/A) EXPLANTATION OF MESH  SURGEON:  Surgeon(s) and Role:    Axel Filler, MD - Primary   ASSISTANTS: Berenda Morale, RNFA   ANESTHESIA:   local, regional, and general  EBL:  100 mL   BLOOD ADMINISTERED:none  DRAINS: none   LOCAL MEDICATIONS USED:  BUPIVICAINE  and OTHER exaparel  SPECIMEN:  No Specimen  DISPOSITION OF SPECIMEN:  N/A  COUNTS:  YES  TOURNIQUET:  * No tourniquets in log *  DICTATION: .Dragon Dictation  After the patient was consented she was taken back to the operating room placed supine position bilateral SCDs in place. After appropriate antibiotics were confirmed timeout was called and all facts verified.  An upper midline incision was made over the area of the hernia.  Dissection was taken down to the hernia sac.  This was circumferentially dissected away from the subcutaneous tissue and the fascia.  This extended medially to the right side of the epigastrium.  At this time the hernia sac was entered sharply.  This was dissected away from the fascia.  There was a mesh on the right side of the fascia that could be seen.  The hernia sac was then cut away from the fascia layer and discarded.  At this time proceeded to lyse adhesions of the omentum to the anterior abdominal wall.  The mesh was dissected off of the posterior rectus fascia.  This was cut away from the fascia.  This was approximately 80% of the mesh.  Once this was done the rectus sheath retracted medially. The inferior portion of the right posterior rectus fascia were then incised with electrocautery. The muscle was adherent to the posterior rectus sheath, However I was  able to dissect off the posterior rectus sheath and its entirety. This was done to the extent of the hernia. At this time a proximal 1 cm medial to the perforating vessels the transversus abdominis muscle was incised as was its fascia. We carried this dissection both inferiorly and superiorly. At this time the transversus abdominis muscle was then dissected laterally in a blunt fashion. A right angle was used to initially started the dissection with electrocautery. This easily dissected laterally. This allowed advancement of the peritoneum medially.  At this time proceeded to retract the left rectus sheath medially and the inferior portion of the rectus sheath was incised. There were minimal muscular adhesions in this area. This easily dissected away. Again approximately 1 cm medial to the perforating vessels to the transversus abdominis muscle was incised as was its fascia. Again we proceeded to dissect with a right angle both superiorly and inferiorly to release the transversus abdominis muscle. At this time proceeded to bluntly dissect away the transverse abdominis muscle from the underlying peritoneum laterally.  The anterior superior portions of the linea alba were kept in place.  Entire dissection of the preperitoneal space bilaterally was approximately 15 cm in a cephalad-caudad direction.  At this time using Allis clamps and peritoneum was brought to the midline. This was easily approximated in the midline. At this time 0 PDS suture was used in a running standard fashion reapproximate the posterior rectus fascia in the midline.  There was no tension on the midline peritoneal closure.  At  this time a piece of 30 x 30 cm UltraPro mesh was then trimmed and placed in the preperitoneal space.  This was approximately 15 x 15 cm.  The Novafil sutures were used to tack the mesh transfasciall using Endo Close device 2 laterally. This was also done superior and inferior portion of the hernia 2 respectively. The  mesh lay flat and the tension was taught laterally on each side. A  Vistaseal glue was then used to help secure the mesh to the midline as well as circumferentially.  10 cc were used.  At this time the rectus fascia was reapproximated midline using 2-0 PDS in a standard running fashion. Again there was no tension on the fascia in the midline.  The subcutaneous tissue on the right side with hernia was largest was reapproximate using 2-0 Vicryl's in interrupted fashion.  The skin was reapproximated using 3-0 Monocryl subcuticular fashion. The bilateral stab wounds were then dressed as was the midline wound with Dermabond.  Patient was awakened from general anesthesia and taken to recovery room stable condition.   Incarcerated: Yes-small bowel and omentum Recurrent: Yes Size: 10.5cm x 8 cm Explantation of mesh: yes   PLAN OF CARE: Discharge to home after PACU  PATIENT DISPOSITION:  PACU - hemodynamically stable.   Delay start of Pharmacological VTE agent (>24hrs) due to surgical blood loss or risk of bleeding: not applicable

## 2021-07-03 ENCOUNTER — Encounter (HOSPITAL_COMMUNITY): Payer: Self-pay | Admitting: General Surgery

## 2021-07-14 ENCOUNTER — Encounter (HOSPITAL_COMMUNITY): Payer: Self-pay | Admitting: General Surgery

## 2023-01-26 IMAGING — CT CT ABD-PELV W/O CM
1 of 2 series · 14 of 32 positions shown, 19 images · non-contrast
Comparison: 05/22/2016

CLINICAL DATA: Ventral hernia, preop

EXAM:
CT ABDOMEN AND PELVIS WITHOUT CONTRAST
TECHNIQUE: Multidetector CT imaging of the abdomen and pelvis was performed
following the standard protocol without IV contrast.

[Series 2: abd/pelvis w/(date) · axial · 0.97mm/px · z∈[-471,-31]mm · 14 of 100 slices shown, 19 images]
[im 6/100  soft-tissue]
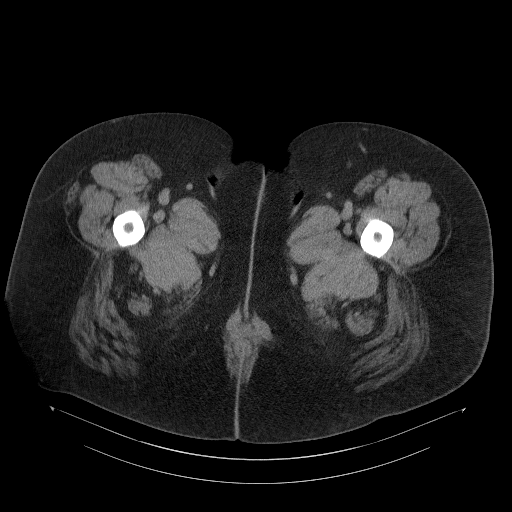
[im 6/100  bone]
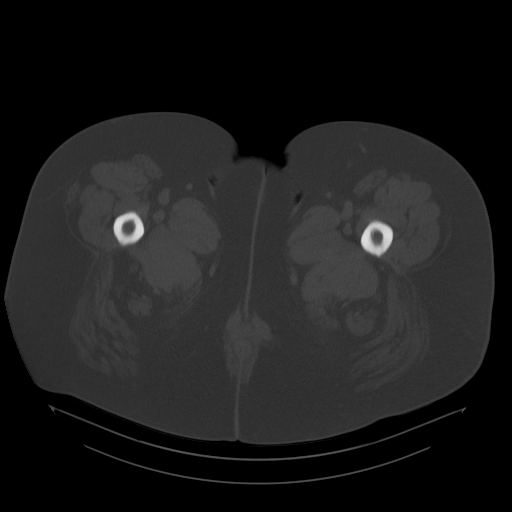
[im 12/100  soft-tissue]
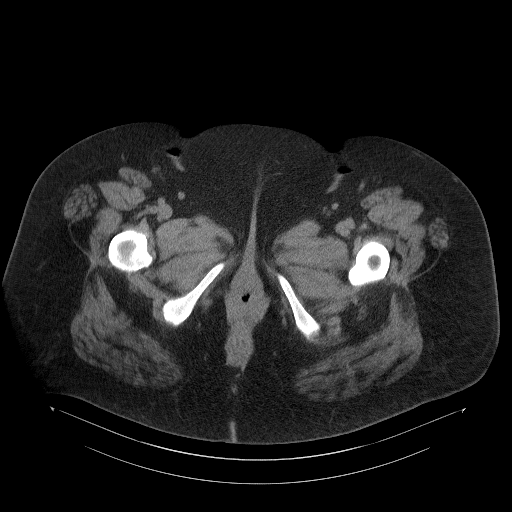
[im 23/100  soft-tissue]
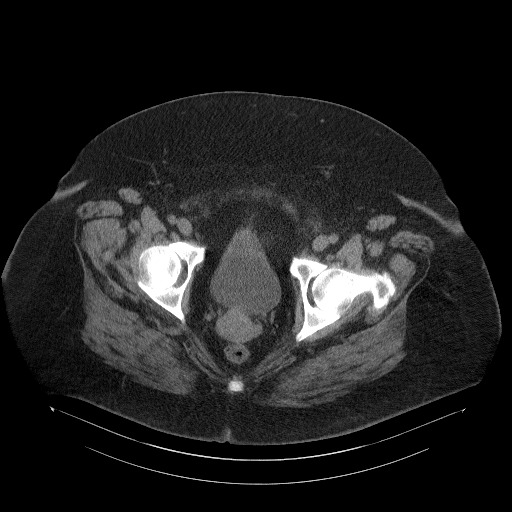
[im 28/100  soft-tissue]
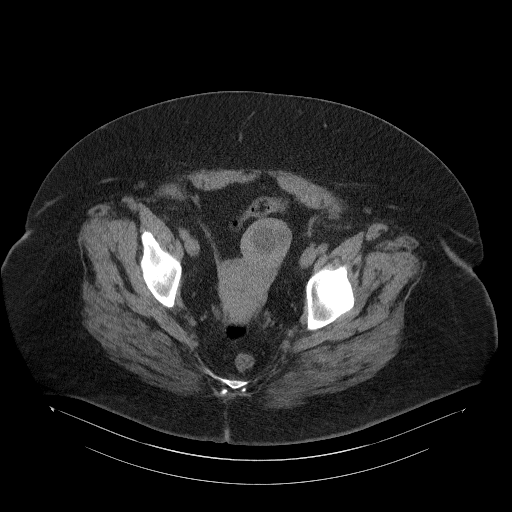
[im 34/100  soft-tissue]
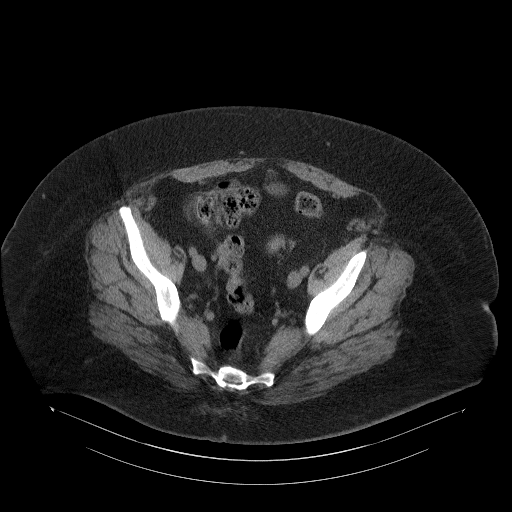
[im 45/100  soft-tissue]
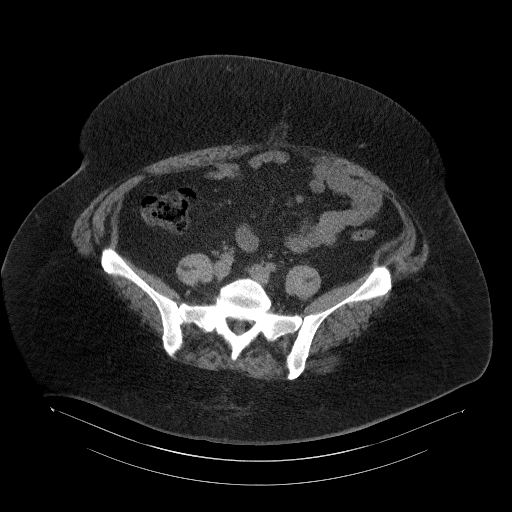
[im 50/100  soft-tissue]
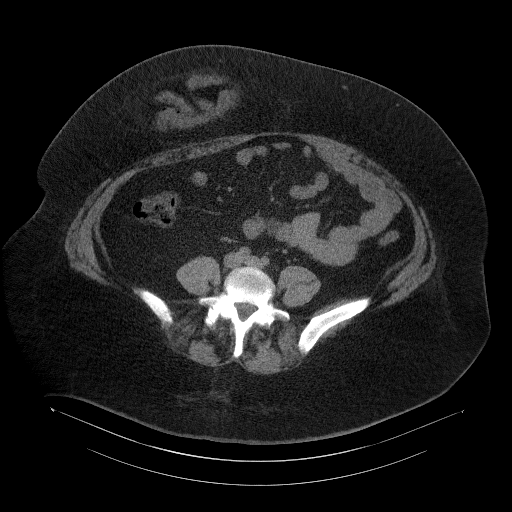
[im 56/100  soft-tissue]
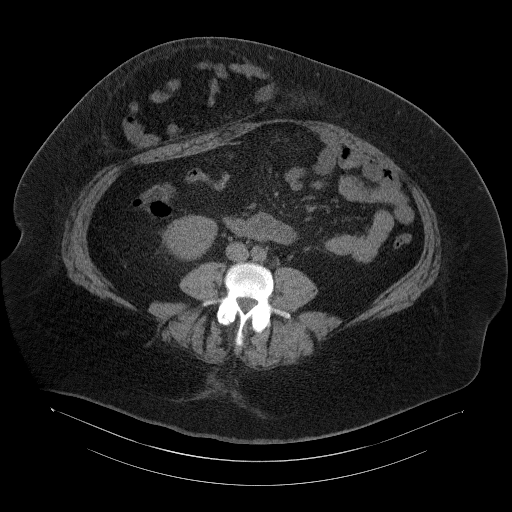
[im 67/100  soft-tissue]
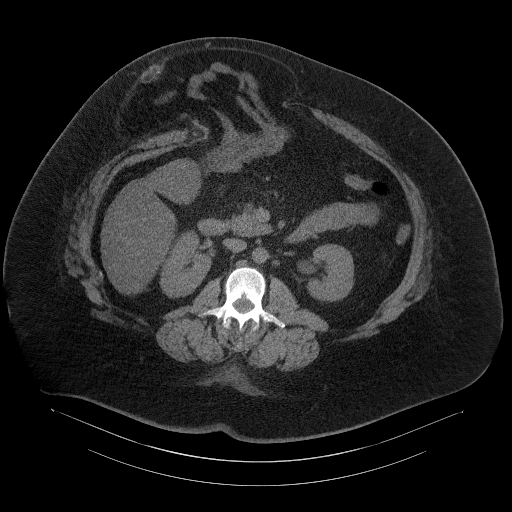
[im 67/100  bone]
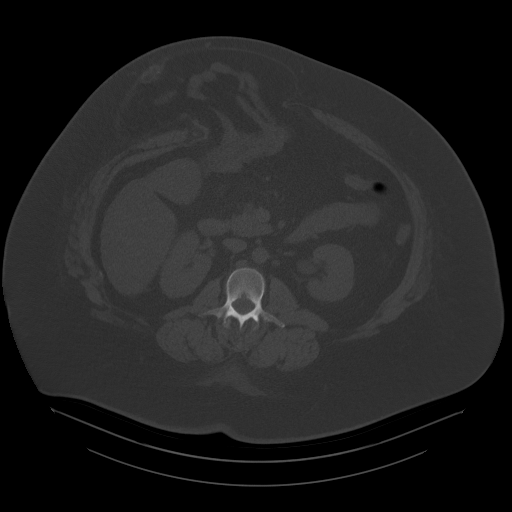
[im 72/100  soft-tissue]
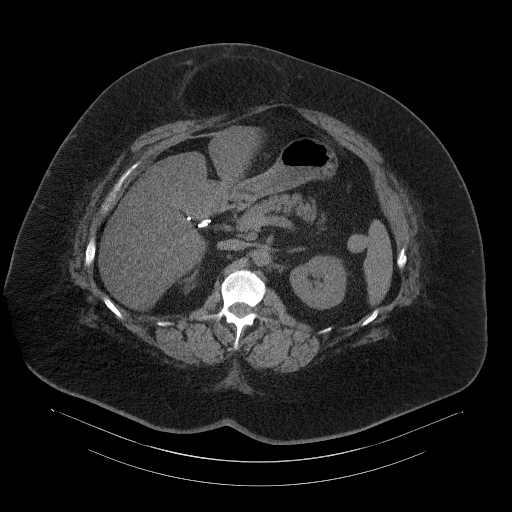
[im 78/100  soft-tissue]
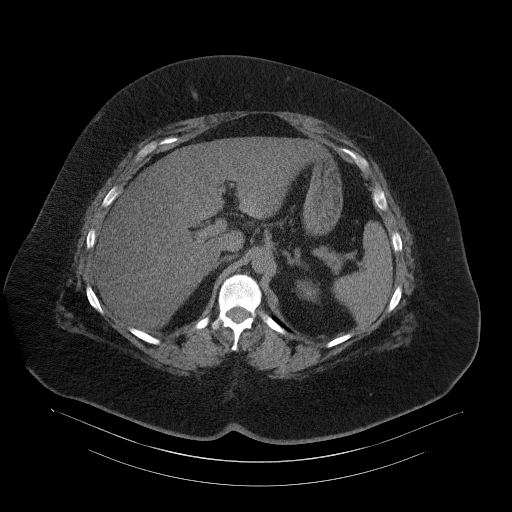
[im 78/100  lung]
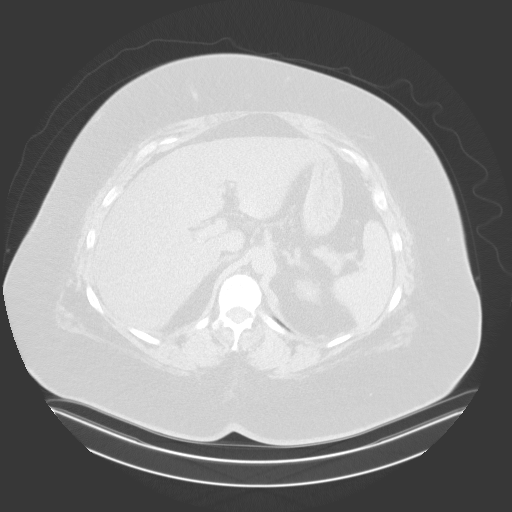
[im 83/100  lung]
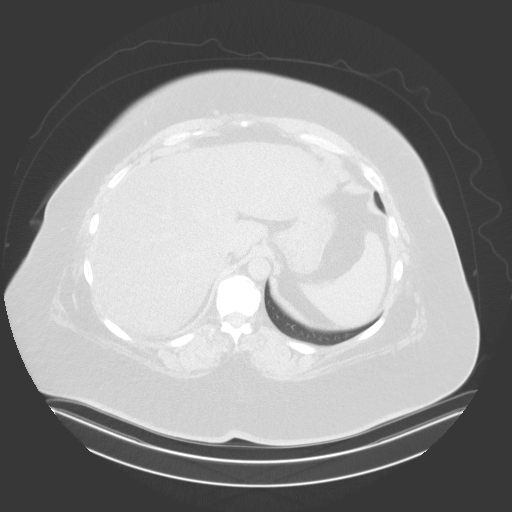
[im 89/100  soft-tissue]
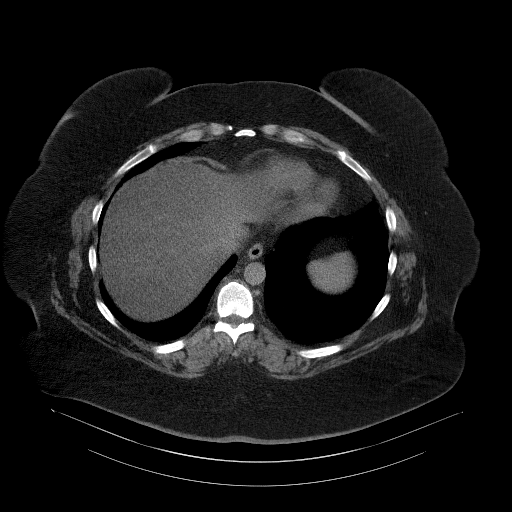
[im 89/100  lung]
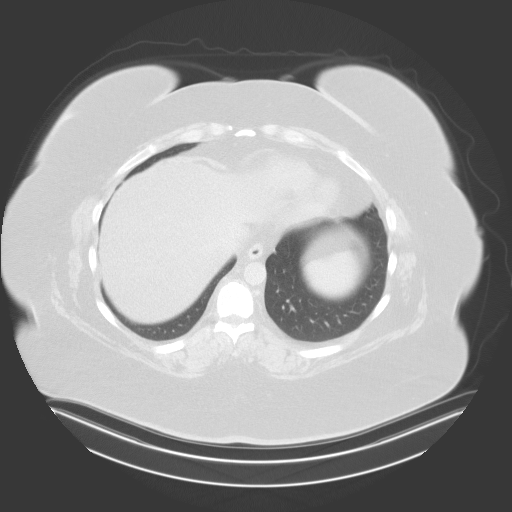
[im 94/100  soft-tissue]
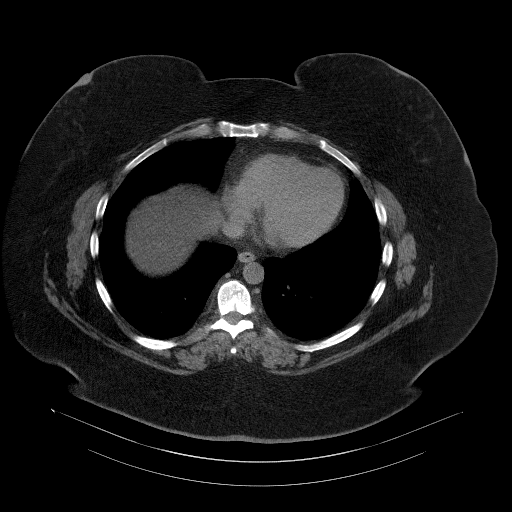
[im 94/100  lung]
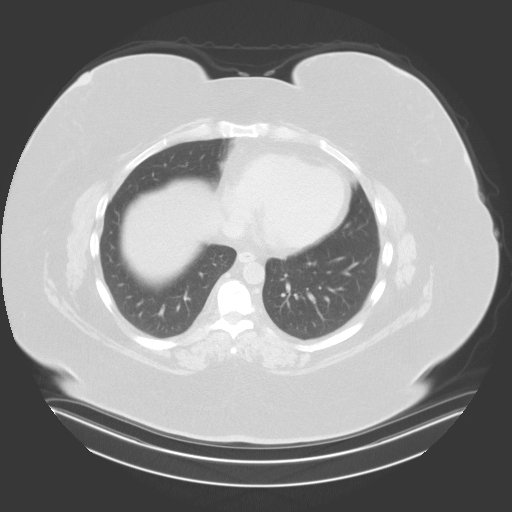

[14 of 32 positions shown; findings below may reference images not displayed]

FINDINGS: Lower chest: No acute abnormality.

Hepatobiliary: Prior cholecystectomy. Diffuse fatty infiltration of
the liver. No focal hepatic abnormality.

Pancreas: No focal abnormality or ductal dilatation.

Spleen: No focal abnormality.  Normal size.

Adrenals/Urinary Tract: No adrenal abnormality. No focal renal
abnormality. No stones or hydronephrosis. Urinary bladder is
unremarkable.

Stomach/Bowel: Stomach, large and small bowel grossly unremarkable.
Large supraumbilical ventral hernia noted containing multiple small
bowel loops. No evidence of bowel obstruction.

Vascular/Lymphatic: No evidence of aneurysm or adenopathy.

Reproductive: Uterus and right adnexa unremarkable. 3 cm cystic area
within the left ovary.

Other: No free fluid or free air.

Musculoskeletal: No acute bony abnormality.
IMPRESSION: Parched supraumbilical ventral hernia containing multiple small
bowel loops. No evidence of bowel obstruction.

Hepatic steatosis.

3 cm left ovarian cyst, likely functional cyst.

## 2024-07-25 ENCOUNTER — Encounter (HOSPITAL_COMMUNITY): Payer: Self-pay | Admitting: General Surgery
